# Patient Record
Sex: Female | Born: 2005 | Race: Black or African American | Hispanic: No | Marital: Single
Health system: Southern US, Community
[De-identification: ages and names within clinical notes are randomized; demographics above are authoritative.]

## PROBLEM LIST (undated history)

## (undated) DIAGNOSIS — F909 Attention-deficit hyperactivity disorder, unspecified type: Secondary | ICD-10-CM

## (undated) DIAGNOSIS — K029 Dental caries, unspecified: Secondary | ICD-10-CM

## (undated) DIAGNOSIS — J45909 Unspecified asthma, uncomplicated: Secondary | ICD-10-CM

## (undated) DIAGNOSIS — R0981 Nasal congestion: Secondary | ICD-10-CM

## (undated) DIAGNOSIS — F840 Autistic disorder: Secondary | ICD-10-CM

## (undated) HISTORY — PX: APPENDECTOMY: SHX54

---
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
## 2023-03-24 DIAGNOSIS — Q922 Partial trisomy: Secondary | ICD-10-CM | POA: Insufficient documentation

## 2023-10-09 DIAGNOSIS — E538 Deficiency of other specified B group vitamins: Secondary | ICD-10-CM | POA: Insufficient documentation

## 2024-05-18 DIAGNOSIS — Z975 Presence of (intrauterine) contraceptive device: Secondary | ICD-10-CM | POA: Insufficient documentation

## 2024-06-01 DIAGNOSIS — G43009 Migraine without aura, not intractable, without status migrainosus: Secondary | ICD-10-CM | POA: Insufficient documentation

## 2024-08-24 ENCOUNTER — Ambulatory Visit (INDEPENDENT_AMBULATORY_CARE_PROVIDER_SITE_OTHER): Payer: MEDICAID | Admitting: Family Medicine

## 2024-08-24 ENCOUNTER — Encounter: Payer: Self-pay | Admitting: Family Medicine

## 2024-08-24 VITALS — BP 118/76 | HR 92 | Temp 97.2°F | Ht 62.0 in | Wt 207.6 lb

## 2024-08-24 DIAGNOSIS — J4520 Mild intermittent asthma, uncomplicated: Secondary | ICD-10-CM | POA: Diagnosis not present

## 2024-08-24 DIAGNOSIS — Z23 Encounter for immunization: Secondary | ICD-10-CM

## 2024-08-24 DIAGNOSIS — G43009 Migraine without aura, not intractable, without status migrainosus: Secondary | ICD-10-CM

## 2024-08-24 DIAGNOSIS — F320 Major depressive disorder, single episode, mild: Secondary | ICD-10-CM | POA: Insufficient documentation

## 2024-08-24 MED ORDER — ALBUTEROL SULFATE HFA 108 (90 BASE) MCG/ACT IN AERS: 2.0000 | INHALATION_SPRAY | Freq: Four times a day (QID) | RESPIRATORY_TRACT | 0 refills | Status: DC | PRN

## 2024-08-24 NOTE — Assessment & Plan Note (Signed)
Stable.  Continue sertraline 50 mg daily.

## 2024-08-24 NOTE — Assessment & Plan Note (Signed)
 No current exacerbation. I will provide an albuterol  MDI for PRN use.

## 2024-08-24 NOTE — Assessment & Plan Note (Signed)
 Stable with infrequent headaches. Continue topiramate 50 mg daily and PRN rizatriptan 10 mg.

## 2024-08-24 NOTE — Progress Notes (Signed)
 [facility] [address] [place] [address] Dept: [phone] Dept Fax: [phone]  New Patient Office Visit  Subjective:    Patient ID: Jennifer Sherman, female    DOB: 07/14/2006, 18 y.o..   MRN: [id number]  Chief Complaint  Patient presents with   Establish Care    NP- establish care. No concerns.    History of Present Illness:  Patient is in today to establish care. Ms. Jennifer Sherman was born in [place]. She was adopted at a young age by her paternal grandmother, Jennifer Sherman. She graduated high school this past Spring. She is planning to pursue college, but is awaiting having some dental work done, so this does not interrupt her education. In the meantime,she is not working. She is single and has no children. Ms. Jennifer Sherman denies tobacco or alcohol use.  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 Her grandmother is currently seeking a legal conservatorship to assist Jennifer Sherman in managing her affairs.  Ms. Jennifer Sherman has a history of migraine headaches. She is managed on topiramate 50 mg daily for migraine prevention, hydroxyzine 25 mg for managing mild to moderate headache, and rizatriptan for treatment of more severe headache. Ms. Jennifer Sherman notes it has been some time since she last needed to use her rizatriptan.  Ms. Jennifer Sherman has a history of mild, intermittent asthma. Although she is prescribed a fluticasone inhaler, she only uses this intermittently. She also has albuterol  for use with a nebulizer. She only has need of this seasonally, when she may have an asthma flare.  Ms. Jennifer Sherman has a history of allergic rhinitis. She manages this with montelukast 5 mg daily and fluticasone nasal spray daily.  Ms. Jennifer Sherman has a history of depression. She is managed on sertraline 50 mg daily. Her mood has been improved.  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   Past Surgical History:  Procedure Laterality Date   DENTAL RESTORATION/EXTRACTION WITH X-RAY N/A 03/17/2017   Procedure: DENTAL RESTORATION/EXTRACTION WITH X-RAY;  Surgeon: Amada Isla Europe, DMD;  Location: [facility];  Service: Dentistry;  Laterality: N/A;   Family History  Adopted: Yes  Problem Relation Age of Onset   Diabetes Father    Hypertension Father    Asthma Father    Heart disease Paternal Grandmother    COPD Paternal Grandmother    Outpatient Medications Prior to Visit  Medication Sig Dispense Refill   albuterol  (PROVENTIL ) (5 MG/ML) 0.5% nebulizer solution Take 2.5 mg by nebulization every 6 (six) hours as needed for wheezing or shortness of breath.     fluticasone (FLONASE) 50 MCG/ACT nasal spray SHAKE LIQUID AND USE 1 SPRAY IN EACH NOSTRIL TWICE DAILY AS NEEDED FOR RHINITIS     fluticasone (FLOVENT HFA) 44 MCG/ACT inhaler Inhale 2 puffs into the lungs.     hydrOXYzine (ATARAX) 25 MG tablet Take 25-50 mg by mouth.     montelukast (SINGULAIR) 5 MG chewable tablet CHEW AND Jennifer Sherman 2 TABLETS(10 MG) BY MOUTH DAILY     rizatriptan (MAXALT) 10 MG tablet Take by mouth.  sertraline (ZOLOFT) 50 MG tablet Take 50 mg by mouth daily.     Sod Fluoride-Potassium Nitrate (PREVIDENT 5000 ENAMEL PROTECT) 1.1-5 % GEL      sodium chloride  (OCEAN) 0.65 % nasal spray One spray each nostril daily for at least 2 wks.     topiramate (TOPAMAX) 50 MG tablet Take 50 mg by mouth daily.     No facility-administered medications prior to visit.   No Known Allergies Objective:   Today's Vitals    08/24/24 0813  BP: 118/76  Pulse: 92  Temp: (!) 97.2 F (36.2 C)  TempSrc: Temporal  SpO2: 99%  Weight: 207 lb 9.6 oz (94.2 kg)  Height: 5' 2 (1.575 m)   Body mass index is 37.97 kg/m.   General: Well developed, well nourished. No acute distress. Lungs: Clear to auscultation bilaterally. No wheezing, rales or rhonchi. Psych: Alert and oriented. Normal mood and affect.  Health Maintenance Due  Topic Date Due   CHLAMYDIA SCREENING  Never done   HIV Screening  Never done   Meningococcal B Vaccine (1 of 2 - Standard) Never done   Hepatitis C Screening  Never done     Assessment & Plan:   Problem List Items Addressed This Visit       Cardiovascular and Mediastinum   Migraine without aura and without status migrainosus, not intractable   Stable with infrequent headaches. Continue topiramate 50 mg daily and PRN rizatriptan 10 mg.      Relevant Medications   topiramate (TOPAMAX) 50 MG tablet   sertraline (ZOLOFT) 50 MG tablet   rizatriptan (MAXALT) 10 MG tablet     Respiratory   Mild intermittent asthma without complication - Primary   No current exacerbation. I will provide an albuterol  MDI for PRN use.      Relevant Medications   montelukast (SINGULAIR) 5 MG chewable tablet   fluticasone (FLOVENT HFA) 44 MCG/ACT inhaler   albuterol  (VENTOLIN  HFA) 108 (90 Base) MCG/ACT inhaler     Other   Depression, major, single episode, mild   Stable. Continue sertraline 50 mg daily.      Relevant Medications   sertraline (ZOLOFT) 50 MG tablet   hydrOXYzine (ATARAX) 25 MG tablet   Other Visit Diagnoses       Need for immunization against influenza       Relevant Orders   Flu vaccine trivalent PF, 6mos and older(Flulaval,Afluria,Fluarix,Fluzone) (Completed)       Return in about 4 weeks (around 09/21/2024) for Annual preventative care.   Garnette CHRISTELLA Simpler, MD

## 2024-09-25 ENCOUNTER — Other Ambulatory Visit: Payer: Self-pay | Admitting: Family Medicine

## 2024-09-25 DIAGNOSIS — J4520 Mild intermittent asthma, uncomplicated: Secondary | ICD-10-CM

## 2024-10-12 ENCOUNTER — Ambulatory Visit: Payer: Self-pay | Admitting: Family Medicine

## 2024-10-12 ENCOUNTER — Ambulatory Visit (INDEPENDENT_AMBULATORY_CARE_PROVIDER_SITE_OTHER): Payer: MEDICAID | Admitting: Family Medicine

## 2024-10-12 ENCOUNTER — Encounter: Payer: Self-pay | Admitting: Family Medicine

## 2024-10-12 VITALS — BP 124/76 | HR 95 | Temp 97.9°F | Ht 62.0 in | Wt 211.6 lb

## 2024-10-12 DIAGNOSIS — E538 Deficiency of other specified B group vitamins: Secondary | ICD-10-CM | POA: Diagnosis not present

## 2024-10-12 DIAGNOSIS — E559 Vitamin D deficiency, unspecified: Secondary | ICD-10-CM

## 2024-10-12 DIAGNOSIS — J4520 Mild intermittent asthma, uncomplicated: Secondary | ICD-10-CM

## 2024-10-12 DIAGNOSIS — R6889 Other general symptoms and signs: Secondary | ICD-10-CM

## 2024-10-12 DIAGNOSIS — K219 Gastro-esophageal reflux disease without esophagitis: Secondary | ICD-10-CM | POA: Diagnosis not present

## 2024-10-12 DIAGNOSIS — Z131 Encounter for screening for diabetes mellitus: Secondary | ICD-10-CM | POA: Diagnosis not present

## 2024-10-12 DIAGNOSIS — Z0000 Encounter for general adult medical examination without abnormal findings: Secondary | ICD-10-CM | POA: Diagnosis not present

## 2024-10-12 DIAGNOSIS — Z13220 Encounter for screening for lipoid disorders: Secondary | ICD-10-CM

## 2024-10-12 DIAGNOSIS — L910 Hypertrophic scar: Secondary | ICD-10-CM | POA: Insufficient documentation

## 2024-10-12 LAB — VITAMIN D 25 HYDROXY (VIT D DEFICIENCY, FRACTURES): VITD: 24.05 ng/mL — ABNORMAL LOW (ref 30.00–100.00)

## 2024-10-12 LAB — LIPID PANEL
Cholesterol: 128 mg/dL (ref 0–200)
HDL: 32.4 mg/dL — ABNORMAL LOW (ref >39.00)
LDL Cholesterol: 67 mg/dL (ref 0–99)
NonHDL: 96.05
Total CHOL/HDL Ratio: 4
Triglycerides: 145 mg/dL (ref 0.0–149.0)
VLDL: 29 mg/dL (ref 0.0–40.0)

## 2024-10-12 LAB — BASIC METABOLIC PANEL WITH GFR
BUN: 9 mg/dL (ref 6–23)
CO2: 26 meq/L (ref 19–32)
Calcium: 9.4 mg/dL (ref 8.4–10.5)
Chloride: 107 meq/L (ref 96–112)
Creatinine, Ser: 0.89 mg/dL (ref 0.40–1.20)
GFR: 93.62 mL/min (ref >60.00)
Glucose, Bld: 111 mg/dL — ABNORMAL HIGH (ref 70–99)
Potassium: 3.5 meq/L (ref 3.5–5.1)
Sodium: 140 meq/L (ref 135–145)

## 2024-10-12 LAB — CBC
HCT: 42.6 % (ref 36.0–49.0)
Hemoglobin: 14.6 g/dL (ref 12.0–16.0)
MCHC: 34.3 g/dL (ref 31.0–37.0)
MCV: 89.5 fl (ref 78.0–98.0)
Platelets: 402 K/uL (ref 150.0–575.0)
RBC: 4.76 Mil/uL (ref 3.80–5.70)
RDW: 13.1 % (ref 11.4–15.5)
WBC: 11.1 K/uL (ref 4.5–13.5)

## 2024-10-12 LAB — TSH: TSH: 3.65 u[IU]/mL (ref 0.40–5.00)

## 2024-10-12 LAB — VITAMIN B12: Vitamin B-12: 244 pg/mL (ref 211–911)

## 2024-10-12 LAB — HEMOGLOBIN A1C: Hgb A1c MFr Bld: 5 % (ref 4.6–6.5)

## 2024-10-12 MED ORDER — OMEPRAZOLE 20 MG PO CPDR: 20.0000 mg | DELAYED_RELEASE_CAPSULE | Freq: Every day | ORAL | 3 refills | Status: AC

## 2024-10-12 NOTE — Assessment & Plan Note (Signed)
 I iwll refer her to plastic surgery to consider keloid removal.

## 2024-10-12 NOTE — Assessment & Plan Note (Signed)
 Stable with infrequent headaches. Continue topiramate 50 mg daily and PRN rizatriptan 10 mg.

## 2024-10-12 NOTE — Assessment & Plan Note (Signed)
Stable.  Continue sertraline 50 mg daily.

## 2024-10-12 NOTE — Progress Notes (Addendum)
 [facility] [address] [place] [address] Dept: [phone] Dept Fax: [phone]  Annual Physical Visit  Subjective:    Patient ID: Jennifer Sherman, female    DOB: [birth year], 18 y.o..   MRN: [id number]  Chief Complaint  Patient presents with   Annual Exam    CPE/labs. C/o having acid reflex.  Has taken OTC Prilosec.  Itching on chest. =   History of Present Illness:  Patient is in today for an annual physical/preventative visit.  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 She is accompanied by her grandmother.   Ms. Eisler has a history of migraine headaches. She is managed on topiramate 50 mg daily for migraine prevention, hydroxyzine 25 mg for managing mild to moderate headache, and rizatriptan for treatment of more severe headache.    Ms. Buster has a history of mild, intermittent asthma. Although she is prescribed a fluticasone inhaler, she only uses this intermittently. She also has albuterol  for use with a nebulizer. She only has need of this seasonally, when she may have an asthma flare. She denies current symptoms.   Ms. Chimenti has a history of allergic rhinitis. She manages this with montelukast 5 mg daily and fluticasone nasal spray daily.    Ms. Borjon has a history of depression. She is managed on sertraline 50 mg daily. Her mood has been improved.  Review of Systems  Constitutional:  Negative for chills, diaphoresis, fever, malaise/fatigue and weight loss.  HENT:  Negative for congestion, ear pain, hearing loss, sinus pain, sore throat and tinnitus.   Eyes:  Negative for blurred vision, pain, discharge and redness.  Respiratory:  Negative for cough, shortness of breath and wheezing.   Cardiovascular:  Negative for chest pain and palpitations.  Gastrointestinal:  Positive for heartburn. Negative for abdominal pain, constipation, diarrhea, nausea  and vomiting.       Notes frequent feelings of food refluxing into her esopahgus. She finds some foods, such as [employer] or frozen dinners, seem to make this happen more often.  Musculoskeletal:  Negative for back pain, joint pain and myalgias.  Skin:  Negative for itching and rash.       Notes keloid scar on chest since early childhood. This is very irritating to her at times.  Psychiatric/Behavioral:  Positive for depression. The patient is not nervous/anxious.        As above.   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   Past Surgical History:  Procedure Laterality Date   APPENDECTOMY     DENTAL RESTORATION/EXTRACTION WITH X-RAY N/A 03/17/2017   Procedure: DENTAL RESTORATION/EXTRACTION WITH X-RAY;  Surgeon: Amada Isla Europe, DMD;  Location: [facility];  Service: Dentistry;  Laterality: N/A;   Family History  Adopted: Yes  Problem Relation Age of Onset   Lupus Mother    Diabetes Father    Hypertension Father    Asthma Father    Stroke Paternal Grandmother    Heart disease Paternal Grandmother    COPD Paternal Grandmother  Outpatient Medications Prior to Visit  Medication Sig Dispense Refill   albuterol  (PROVENTIL ) (5 MG/ML) 0.5% nebulizer solution Take 2.5 mg by nebulization every 6 (six) hours as needed for wheezing or shortness of breath.     albuterol  (VENTOLIN  HFA) 108 (90 Base) MCG/ACT inhaler INHALE 2 PUFFS INTO THE LUNGS EVERY 6 HOURS AS NEEDED FOR WHEEZING OR SHORTNESS OF BREATH 8.5 g 3    fluticasone (FLONASE) 50 MCG/ACT nasal spray SHAKE LIQUID AND USE 1 SPRAY IN EACH NOSTRIL TWICE DAILY AS NEEDED FOR RHINITIS     fluticasone (FLOVENT HFA) 44 MCG/ACT inhaler Inhale 2 puffs into the lungs.     hydrOXYzine (ATARAX) 25 MG tablet Take 25-50 mg by mouth.     montelukast (SINGULAIR) 5 MG chewable tablet CHEW AND SWALLOW 2 TABLETS(10 MG) BY MOUTH DAILY     rizatriptan (MAXALT) 10 MG tablet Take by mouth.     sertraline (ZOLOFT) 50 MG tablet Take 50 mg by mouth daily.     Sod Fluoride-Potassium Nitrate (PREVIDENT 5000 ENAMEL PROTECT) 1.1-5 % GEL      sodium chloride  (OCEAN) 0.65 % nasal spray One spray each nostril daily for at least 2 wks.     topiramate (TOPAMAX) 50 MG tablet Take 50 mg by mouth daily.     No facility-administered medications prior to visit.   No Known Allergies Objective:   Today's Vitals   10/12/24 0818  BP: 124/76  Pulse: 95  Temp: 97.9 F (36.6 C)  TempSrc: Temporal  SpO2: 98%  Weight: 211 lb 9.6 oz (96 kg)  Height: 5' 2 (1.575 m)   Body mass index is 38.7 kg/m.   General: Well developed, well nourished. No acute distress. HEENT: Normocephalic, non-traumatic. PERRL, EOMI. Conjunctiva clear. External ears normal. EAC and TMs   normal bilaterally. Nose clear without congestion or rhinorrhea. Mucous membranes moist. Oropharynx clear.   Good dentition. Neck: Supple. No lymphadenopathy. No thyromegaly. Lungs: Clear to auscultation bilaterally. No wheezing, rales or rhonchi. CV: RRR without murmurs or rubs. Pulses 2+ bilaterally. Abdomen: Soft, non-tender. Bowel sounds positive, normal pitch and frequency. No hepatosplenomegaly. No   rebound or guarding. Extremities: Full ROM. No joint swelling or tenderness. No edema noted. Skin: Warm and dry. No rashes. Large keloid scar over the upper mid chest. Psych: Alert and oriented. Normal mood and affect.  Health Maintenance Due  Topic Date Due   CHLAMYDIA SCREENING  Never done   HIV Screening   Never done   Meningococcal B Vaccine (1 of 2 - Standard) Never done   Hepatitis C Screening  Never done   COVID-19 Vaccine (3 - 2025-26 season) 07/04/2024    Assessment & Plan:   Problem List Items Addressed This Visit       Respiratory   Mild intermittent asthma without complication   No current exacerbation. Continue albuterol  MDI for PRN use.        Digestive   GERD (gastroesophageal reflux disease)   Has been managed with OTC Prilosec, which helps. I will send in a prescription for this.      Relevant Medications   omeprazole (PRILOSEC) 20 MG capsule     Other   Annual physical exam - Primary   Overall health is fair. Recommend regular exercise. Discussed recommended screenings and immunizations.       Vitamin B12 deficiency   I will check her Vitamin B12 level today.      Relevant Orders   Vitamin B12 (Completed)   Vitamin D deficiency  I will check her Vitamin D level today.      Relevant Orders   VITAMIN D 25 Hydroxy (Vit-D Deficiency, Fractures) (Completed)   Other Visit Diagnoses       Cold intolerance       I will check a TSH to evalaute for possible hypothyroidism.   Relevant Orders   TSH (Completed)   CBC (Completed)     Screening for diabetes mellitus (DM)       Relevant Orders   Hemoglobin A1c (Completed)   Basic metabolic panel with GFR (Completed)     Screening for lipid disorders       Relevant Orders   Lipid panel (Completed)       Return in about 3 months (around 01/10/2025) for Reassessment.   Garnette CHRISTELLA Simpler, MD  I,Emily Lagle,acting as a scribe for Garnette CHRISTELLA Simpler, MD.,have documented all relevant documentation on the behalf of Garnette CHRISTELLA Simpler, MD.  I, Garnette CHRISTELLA Simpler, MD, have reviewed all documentation for this visit. The documentation on 10/12/2024 for the exam, diagnosis, procedures, and orders are all accurate and complete.

## 2024-10-12 NOTE — Assessment & Plan Note (Addendum)
 No current exacerbation. Continue albuterol  MDI for PRN use.

## 2024-10-12 NOTE — Addendum Note (Signed)
 Addended by: THEDORA GARNETTE HERO on: 10/12/2024 05:45 PM   Modules accepted: Orders

## 2024-10-12 NOTE — Assessment & Plan Note (Signed)
I will check her Vitamin D level today 

## 2024-10-12 NOTE — Assessment & Plan Note (Signed)
 Overall health is fair. Recommend regular exercise. Discussed recommended screenings and immunizations.

## 2024-10-12 NOTE — Assessment & Plan Note (Signed)
 I will check her Vitamin B12 level today.

## 2024-10-12 NOTE — Assessment & Plan Note (Signed)
 Has been managed with OTC Prilosec, which helps. I will send in a prescription for this.

## 2025-01-16 ENCOUNTER — Ambulatory Visit: Payer: MEDICAID | Admitting: Family Medicine
